# Patient Record
Sex: Female | Born: 1985 | Smoking: Never smoker
Health system: Southern US, Community
[De-identification: ages and names within clinical notes are randomized; demographics above are authoritative.]

## PROBLEM LIST (undated history)

## (undated) DIAGNOSIS — Z789 Other specified health status: Secondary | ICD-10-CM

## (undated) HISTORY — PX: CARPAL TUNNEL RELEASE: SHX101

---

## 2007-01-14 ENCOUNTER — Ambulatory Visit: Payer: Self-pay | Admitting: Obstetrics and Gynecology

## 2008-04-04 IMAGING — US ULTRASOUND RIGHT BREAST
1 series · 17 of 25 positions shown · non-contrast
Comparison: none

REASON FOR EXAM: Breast tenderness and discharge, nipple inverted
COMMENTS:

[Series 1: ultrasound right breast · 17 of 28 slices shown]
[im 1/28]
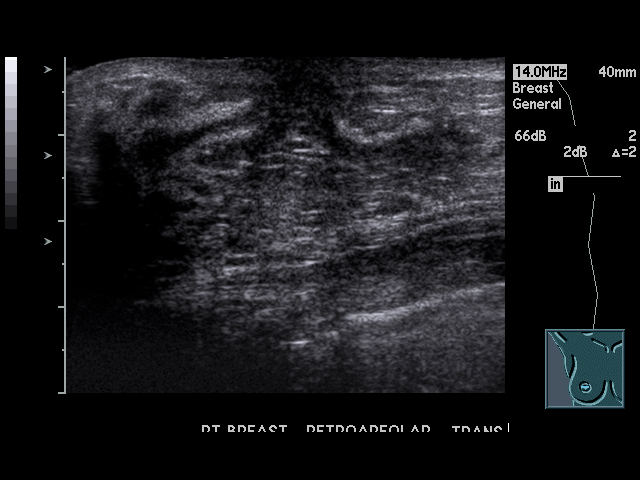
[im 3/28]
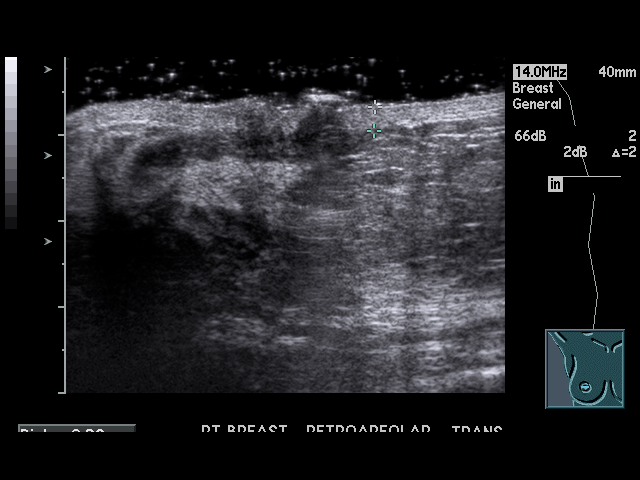
[im 4/28]
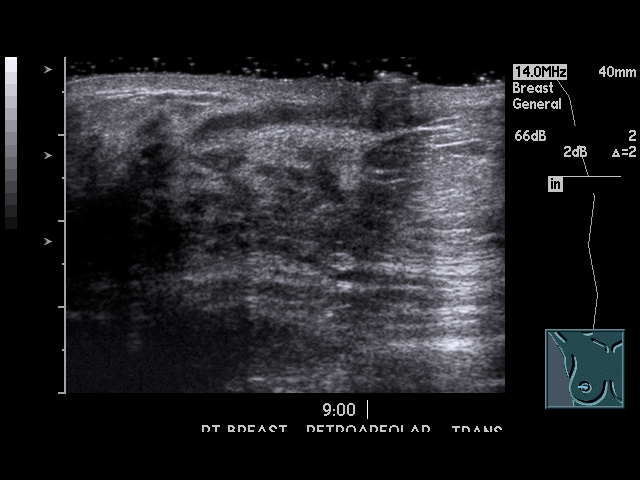
[im 6/28]
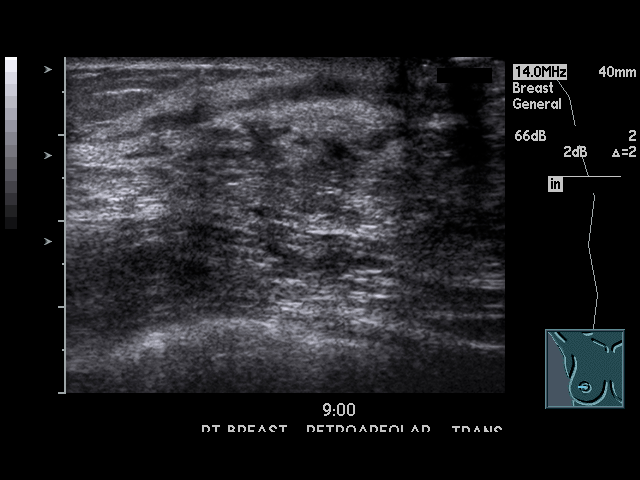
[im 7/28]
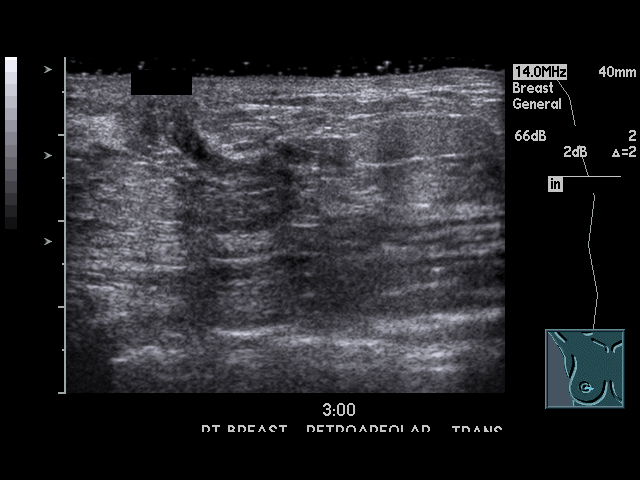
[im 10/28]
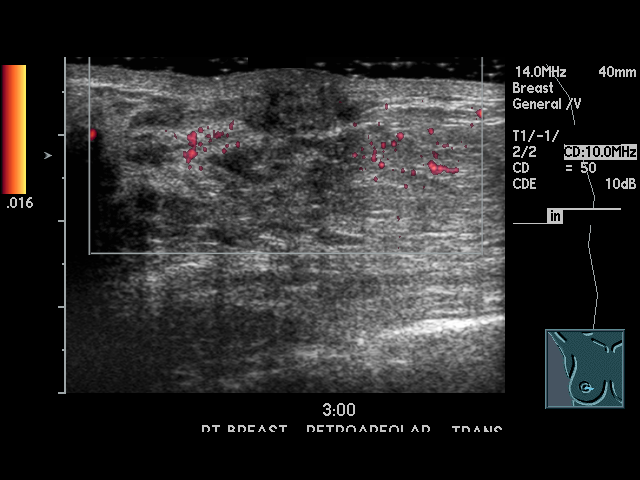
[im 11/28]
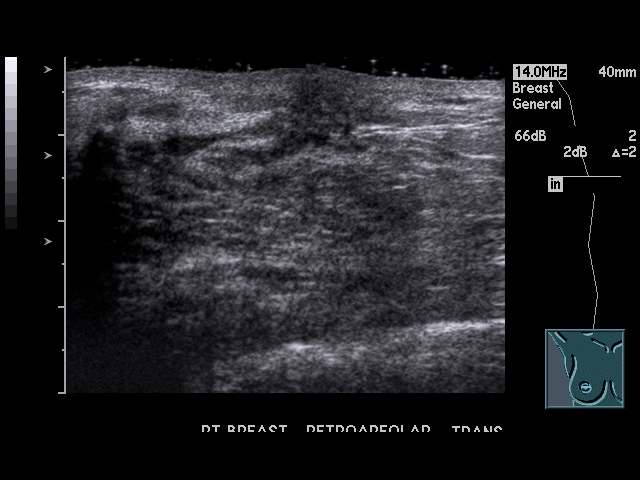
[im 13/28]
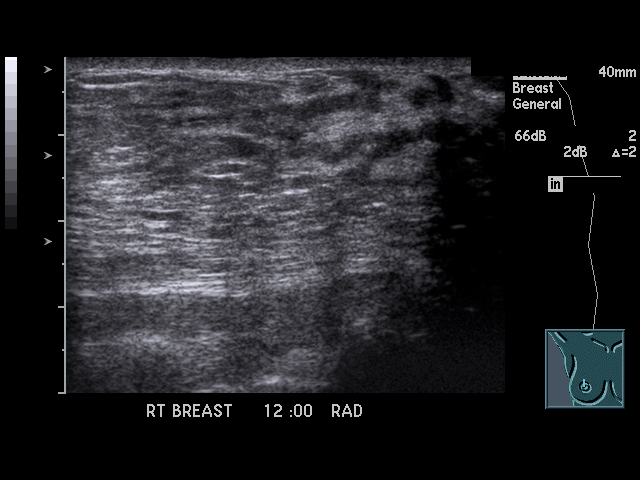
[im 14/28]
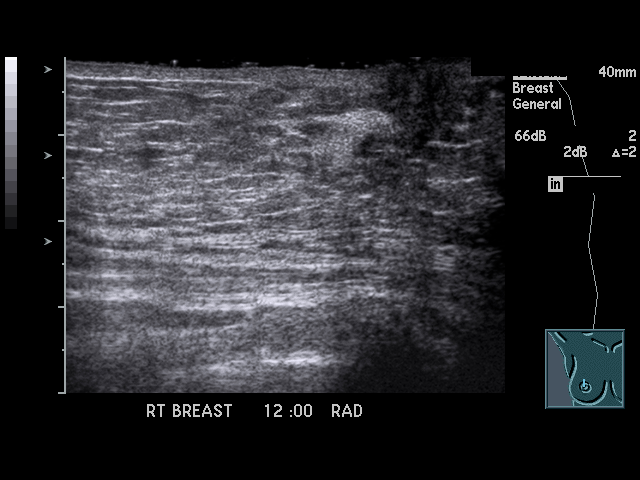
[im 15/28]
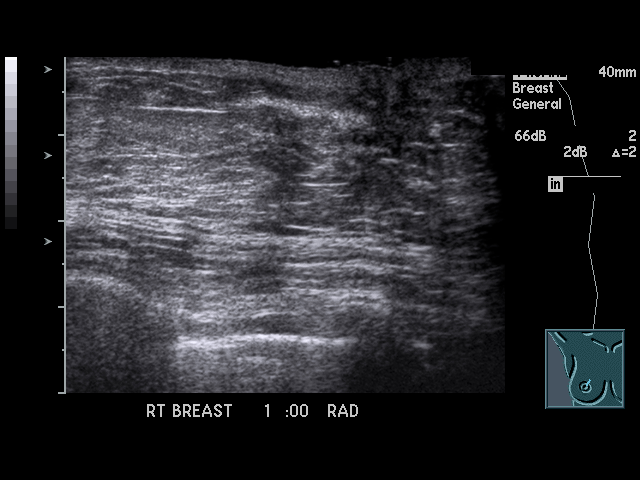
[im 17/28]
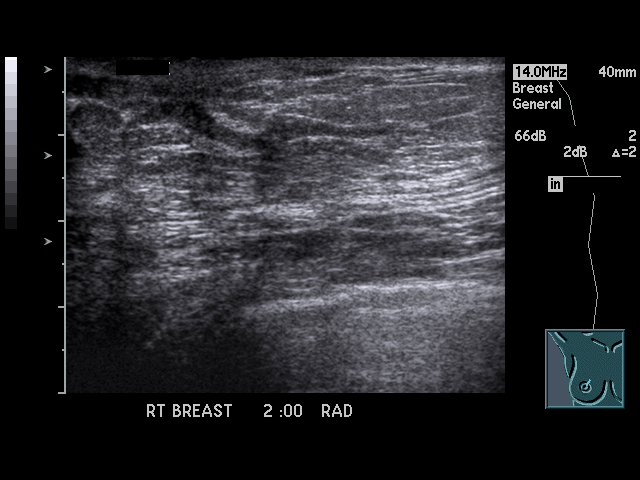
[im 19/28]
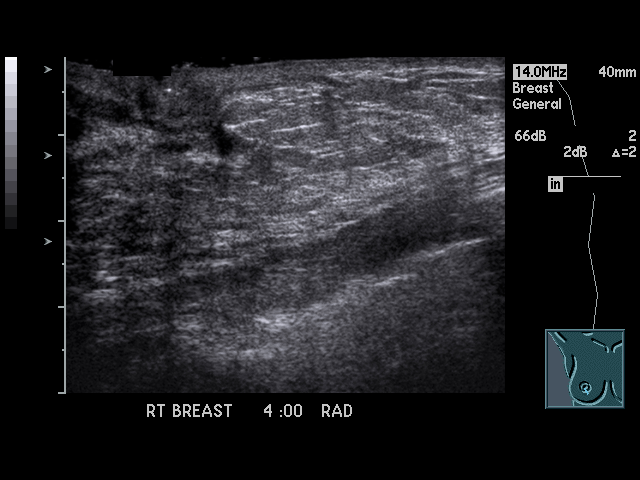
[im 21/28]
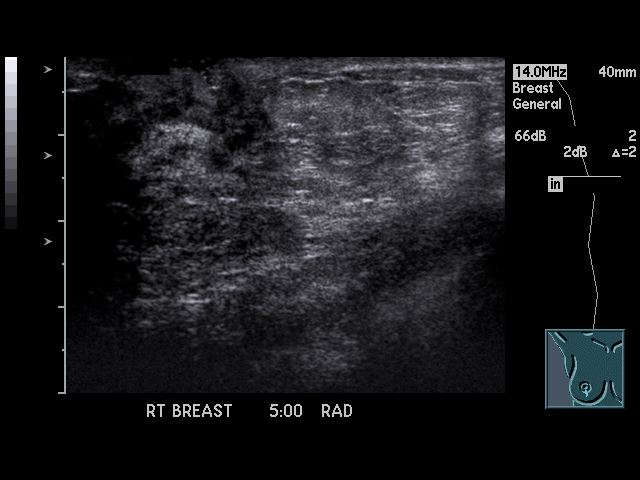
[im 22/28]
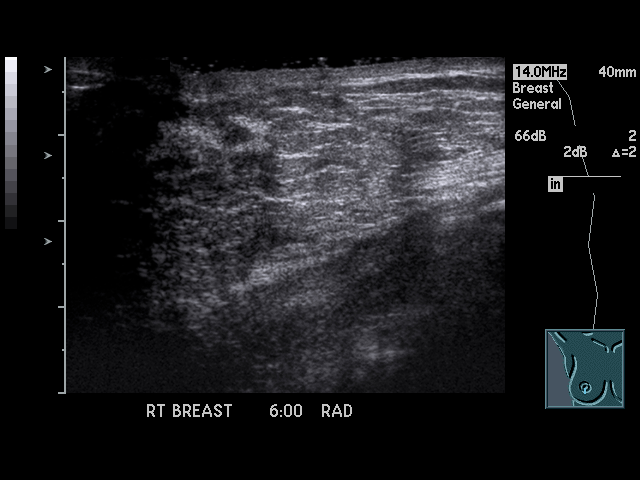
[im 24/28]
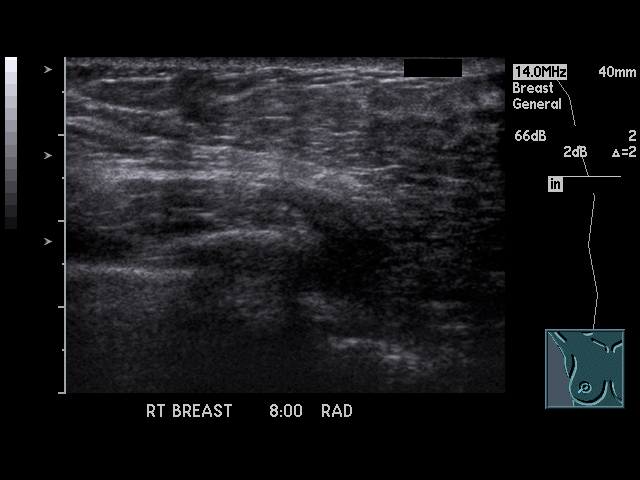
[im 25/28]
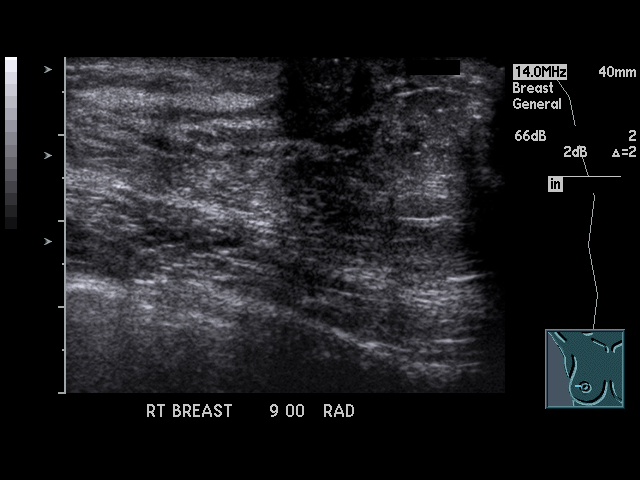
[im 28/28]
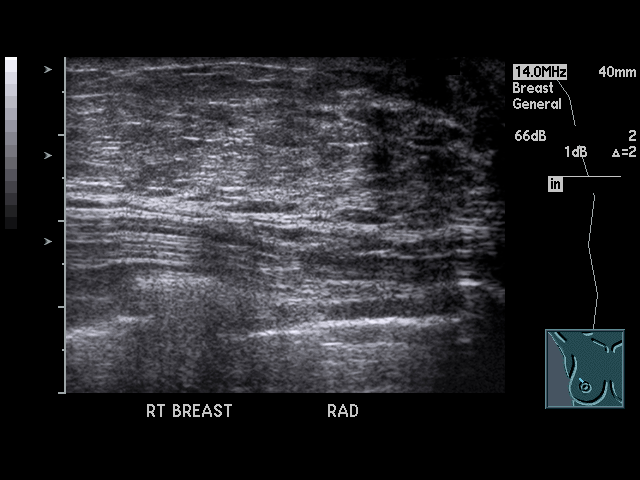

[17 of 25 positions shown; findings below may reference images not displayed]

PROCEDURE:     US  - US BREAST RIGHT  - January 14, 2007  [DATE]

RESULT:     The patient has a history of recent nipple inversion and nipple
discharge.

Targeted periareolar ultrasound was performed and shows a few, mildly
dilated ducts consistent with ductal ectasia. No mass lesions within the
ducts are seen to indicate papilloma formation. No retroareolar mass lesions
are seen. There is no distortion of the breast architecture.
IMPRESSION: 1.     Normal study except for mild ductal ectasia.
2.     No evidence for malignancy is seen.
3.     BI-RADS: Category 2 - Benign Finding.

## 2012-08-18 ENCOUNTER — Other Ambulatory Visit (HOSPITAL_COMMUNITY)
Admission: RE | Admit: 2012-08-18 | Discharge: 2012-08-18 | Disposition: A | Discharge status: Home / Self Care | Payer: 59 | Source: Ambulatory Visit | Attending: Family Medicine | Admitting: Family Medicine

## 2012-08-18 DIAGNOSIS — Z01419 Encounter for gynecological examination (general) (routine) without abnormal findings: Secondary | ICD-10-CM | POA: Insufficient documentation

## 2020-09-21 DIAGNOSIS — Z20822 Contact with and (suspected) exposure to covid-19: Secondary | ICD-10-CM | POA: Diagnosis not present

## 2020-09-26 DIAGNOSIS — Z20822 Contact with and (suspected) exposure to covid-19: Secondary | ICD-10-CM | POA: Diagnosis not present

## 2020-11-06 DIAGNOSIS — R200 Anesthesia of skin: Secondary | ICD-10-CM | POA: Diagnosis not present

## 2021-03-07 DIAGNOSIS — G5601 Carpal tunnel syndrome, right upper limb: Secondary | ICD-10-CM | POA: Diagnosis not present

## 2021-03-12 DIAGNOSIS — M545 Low back pain, unspecified: Secondary | ICD-10-CM | POA: Diagnosis not present

## 2021-03-12 DIAGNOSIS — G5603 Carpal tunnel syndrome, bilateral upper limbs: Secondary | ICD-10-CM | POA: Diagnosis not present

## 2021-03-25 DIAGNOSIS — M545 Low back pain, unspecified: Secondary | ICD-10-CM | POA: Diagnosis not present

## 2021-07-31 LAB — OB RESULTS CONSOLE RPR: RPR: NONREACTIVE

## 2021-07-31 LAB — HEPATITIS C ANTIBODY: HCV Ab: NEGATIVE

## 2021-07-31 LAB — OB RESULTS CONSOLE ABO/RH: RH Type: POSITIVE

## 2021-07-31 LAB — OB RESULTS CONSOLE GC/CHLAMYDIA
Chlamydia: NEGATIVE
Neisseria Gonorrhea: NEGATIVE

## 2021-07-31 LAB — OB RESULTS CONSOLE HEPATITIS B SURFACE ANTIGEN: Hepatitis B Surface Ag: NEGATIVE

## 2021-07-31 LAB — OB RESULTS CONSOLE RUBELLA ANTIBODY, IGM: Rubella: IMMUNE

## 2021-07-31 LAB — OB RESULTS CONSOLE ANTIBODY SCREEN: Antibody Screen: NEGATIVE

## 2021-07-31 LAB — OB RESULTS CONSOLE HIV ANTIBODY (ROUTINE TESTING): HIV: NONREACTIVE

## 2021-12-17 LAB — OB RESULTS CONSOLE HIV ANTIBODY (ROUTINE TESTING): HIV: NONREACTIVE

## 2022-02-11 LAB — OB RESULTS CONSOLE GBS: GBS: NEGATIVE

## 2022-02-23 ENCOUNTER — Inpatient Hospital Stay (HOSPITAL_BASED_OUTPATIENT_CLINIC_OR_DEPARTMENT_OTHER): Payer: 59

## 2022-02-23 ENCOUNTER — Inpatient Hospital Stay (HOSPITAL_COMMUNITY)
Admission: AD | Admit: 2022-02-23 | Discharge: 2022-02-23 | Disposition: A | Discharge status: Home / Self Care | Payer: 59 | Attending: Obstetrics and Gynecology | Admitting: Obstetrics and Gynecology

## 2022-02-23 ENCOUNTER — Encounter (HOSPITAL_COMMUNITY): Payer: Self-pay

## 2022-02-23 ENCOUNTER — Inpatient Hospital Stay (HOSPITAL_COMMUNITY): Payer: 59

## 2022-02-23 DIAGNOSIS — O09523 Supervision of elderly multigravida, third trimester: Secondary | ICD-10-CM | POA: Insufficient documentation

## 2022-02-23 DIAGNOSIS — J069 Acute upper respiratory infection, unspecified: Secondary | ICD-10-CM

## 2022-02-23 DIAGNOSIS — O26893 Other specified pregnancy related conditions, third trimester: Secondary | ICD-10-CM | POA: Insufficient documentation

## 2022-02-23 DIAGNOSIS — Z3A38 38 weeks gestation of pregnancy: Secondary | ICD-10-CM | POA: Insufficient documentation

## 2022-02-23 DIAGNOSIS — O99513 Diseases of the respiratory system complicating pregnancy, third trimester: Secondary | ICD-10-CM | POA: Insufficient documentation

## 2022-02-23 DIAGNOSIS — Z1152 Encounter for screening for COVID-19: Secondary | ICD-10-CM | POA: Diagnosis not present

## 2022-02-23 DIAGNOSIS — J329 Chronic sinusitis, unspecified: Secondary | ICD-10-CM | POA: Insufficient documentation

## 2022-02-23 DIAGNOSIS — R0602 Shortness of breath: Secondary | ICD-10-CM | POA: Diagnosis present

## 2022-02-23 DIAGNOSIS — O368390 Maternal care for abnormalities of the fetal heart rate or rhythm, unspecified trimester, not applicable or unspecified: Secondary | ICD-10-CM

## 2022-02-23 DIAGNOSIS — J0110 Acute frontal sinusitis, unspecified: Secondary | ICD-10-CM | POA: Diagnosis not present

## 2022-02-23 HISTORY — DX: Other specified health status: Z78.9

## 2022-02-23 LAB — CBC WITH DIFFERENTIAL/PLATELET
Abs Immature Granulocytes: 0.1 10*3/uL — ABNORMAL HIGH (ref 0.00–0.07)
Basophils Absolute: 0 10*3/uL (ref 0.0–0.1)
Basophils Relative: 0 %
Eosinophils Absolute: 0 10*3/uL (ref 0.0–0.5)
Eosinophils Relative: 0 %
HCT: 42.9 % (ref 36.0–46.0)
Hemoglobin: 14.3 g/dL (ref 12.0–15.0)
Immature Granulocytes: 1 %
Lymphocytes Relative: 9 %
Lymphs Abs: 1.4 10*3/uL (ref 0.7–4.0)
MCH: 30.4 pg (ref 26.0–34.0)
MCHC: 33.3 g/dL (ref 30.0–36.0)
MCV: 91.3 fL (ref 80.0–100.0)
Monocytes Absolute: 1 10*3/uL (ref 0.1–1.0)
Monocytes Relative: 6 %
Neutro Abs: 12.6 10*3/uL — ABNORMAL HIGH (ref 1.7–7.7)
Neutrophils Relative %: 84 %
Platelets: 197 10*3/uL (ref 150–400)
RBC: 4.7 MIL/uL (ref 3.87–5.11)
RDW: 13.2 % (ref 11.5–15.5)
WBC: 15.1 10*3/uL — ABNORMAL HIGH (ref 4.0–10.5)
nRBC: 0 % (ref 0.0–0.2)

## 2022-02-23 LAB — RESP PANEL BY RT-PCR (RSV, FLU A&B, COVID)  RVPGX2
Influenza A by PCR: NEGATIVE
Influenza B by PCR: NEGATIVE
Resp Syncytial Virus by PCR: NEGATIVE
SARS Coronavirus 2 by RT PCR: NEGATIVE

## 2022-02-23 LAB — BASIC METABOLIC PANEL
Anion gap: 12 (ref 5–15)
BUN: 10 mg/dL (ref 6–20)
CO2: 19 mmol/L — ABNORMAL LOW (ref 22–32)
Calcium: 8.9 mg/dL (ref 8.9–10.3)
Chloride: 102 mmol/L (ref 98–111)
Creatinine, Ser: 0.68 mg/dL (ref 0.44–1.00)
GFR, Estimated: >60 mL/min (ref >60)
Glucose, Bld: 94 mg/dL (ref 70–99)
Potassium: 4.1 mmol/L (ref 3.5–5.1)
Sodium: 133 mmol/L — ABNORMAL LOW (ref 135–145)

## 2022-02-23 LAB — URINALYSIS, ROUTINE W REFLEX MICROSCOPIC
Bilirubin Urine: NEGATIVE
Glucose, UA: NEGATIVE mg/dL
Hgb urine dipstick: NEGATIVE
Ketones, ur: 20 mg/dL — AB
Leukocytes,Ua: NEGATIVE
Nitrite: NEGATIVE
Protein, ur: NEGATIVE mg/dL
Specific Gravity, Urine: 1.017 (ref 1.005–1.030)
pH: 7 (ref 5.0–8.0)

## 2022-02-23 LAB — AMNISURE RUPTURE OF MEMBRANE (ROM) NOT AT ARMC: Amnisure ROM: NEGATIVE

## 2022-02-23 LAB — LACTIC ACID, PLASMA: Lactic Acid, Venous: 1 mmol/L (ref 0.5–1.9)

## 2022-02-23 MED ORDER — LACTATED RINGERS IV BOLUS
1000.0000 mL | Freq: Once | INTRAVENOUS | Status: AC
  Administered 2022-02-23: 1000 mL via INTRAVENOUS

## 2022-02-23 MED ORDER — AMOXICILLIN-POT CLAVULANATE 875-125 MG PO TABS: 1.0000 | ORAL_TABLET | Freq: Two times a day (BID) | ORAL | 0 refills | Status: DC

## 2022-02-23 MED ORDER — GUAIFENESIN ER 600 MG PO TB12
600.0000 mg | ORAL_TABLET | Freq: Once | ORAL | Status: AC
  Administered 2022-02-23: 600 mg via ORAL
  Filled 2022-02-23: qty 1

## 2022-02-23 MED ORDER — LACTATED RINGERS IV BOLUS
500.0000 mL | Freq: Once | INTRAVENOUS | Status: AC
  Administered 2022-02-23: 500 mL via INTRAVENOUS

## 2022-02-23 MED ORDER — ACETAMINOPHEN 500 MG PO TABS
1000.0000 mg | ORAL_TABLET | Freq: Once | ORAL | Status: AC
  Administered 2022-02-23: 1000 mg via ORAL
  Filled 2022-02-23: qty 2

## 2022-02-23 MED ORDER — ALBUTEROL SULFATE HFA 108 (90 BASE) MCG/ACT IN AERS
2.0000 | INHALATION_SPRAY | RESPIRATORY_TRACT | Status: DC | PRN
  Administered 2022-02-23: 2 via RESPIRATORY_TRACT
  Filled 2022-02-23: qty 6.7

## 2022-02-23 MED ORDER — ALBUTEROL SULFATE HFA 108 (90 BASE) MCG/ACT IN AERS: 2.0000 | INHALATION_SPRAY | RESPIRATORY_TRACT | 0 refills | Status: DC | PRN

## 2022-02-23 NOTE — Discharge Instructions (Signed)

## 2022-02-23 NOTE — MAU Note (Signed)
Patient reports that she's had a gush of fluid in the last few minutes, unsure if it was urine or her water when she was coughing, CNM notified.

## 2022-02-23 NOTE — MAU Note (Signed)
.  Kristin Richard is a 36 y.o. at Unknown here in MAU reporting: ongoing/worsening cold symptoms.  Pt reports today her cough is worse and that she has had a fever of 101.  Reports she last took tylenol at 0800 this morning.  Reports SOB that worsens with activity and pain in her chest when trying to take deep breaths.  Denies LOF, reg ctx or LOF.  +FM   Onset of complaint: 2 weeks ago Pain score:  There were no vitals filed for this visit.   FHT:195 Lab orders placed from triage:

## 2022-02-23 NOTE — MAU Provider Note (Signed)
History     CSN: [id number]  Arrival date and time: 02/23/22 1334   Event Date/Time   First Provider Initiated Contact with Patient 02/23/22 1351      Chief Complaint  Patient presents with   Shortness of Breath   Cough   Chest Pain   HPI Kristin Richard is a 36 y.o. G3P2002 38.0 weeks who presents to MAU with new complaint of shortness of breath and activity intolerance. Patient states and  her entire family have been sick for about two weeks. She woke up this morning with SOB, chest tightness and activity intolerance that were significantly worse than they had ever been. She took Tylenol at 0800 but did not experience relief.  Patient receives care with [facility]  OB History     Gravida  1   Para      Term      Preterm      AB      Living         SAB      IAB      Ectopic      Multiple      Live Births              Past Medical History:  Diagnosis Date   Medical history non-contributory     Past Surgical History:  Procedure Laterality Date   CARPAL TUNNEL RELEASE      History reviewed. No pertinent family history.     Allergies: Not on File  No medications prior to admission.    Review of Systems  Constitutional:  Positive for fatigue and fever.  Respiratory:  Positive for chest tightness and shortness of breath.   Musculoskeletal:  Positive for arthralgias.  All other systems reviewed and are negative.  Physical Exam   Blood pressure 137/72, pulse (!) 118, temperature 99.6 F (37.6 C), temperature source Oral, resp. rate (!) 24, weight 105.1 kg, SpO2 98 %.  Physical Exam Vitals and nursing note reviewed.  Constitutional:      Appearance: She is well-developed. She is obese. She is ill-appearing.  Cardiovascular:     Rate and Rhythm: Normal rate and regular rhythm.  Pulmonary:     Effort: Pulmonary effort is normal.     Comments: Patient vocalizing in an effort to avoid vocalizing when CNM initially attempted to auscultate  lung sounds. CNM returned to bedside after Albuterol, lungs CTAB Skin:    Capillary Refill: Capillary refill takes less than 2 seconds.  Neurological:     Mental Status: She is alert and oriented to person, place, and time.  Psychiatric:        Mood and Affect: Mood normal.        Behavior: Behavior normal.     MAU Course  Procedures  MDM --Reactive tracing s/p fluid bolus --Fetal tachycardia throughout encounter, baseline improved to 165 prior to BPP, mod var, + accels, no decels --Toco: UI --Evaluation discussed with Dr. Donavan Foil, who agrees patient may merit observation on OBSC due to persistent fetal tachycardia. Report called to Dr. Reina Fuse at 1600. Verbal order placed for BPP --Dr. Reina Fuse in unit around 1700 hours (walked patient back to room following BPP). Care assumed of patient. Per Dr. Reina Fuse patient to receive additional 500 mL bolus, d/c home with treatment for Sinusitis.   Orders Placed This Encounter  Procedures   Resp panel by RT-PCR (RSV, Flu A&B, Covid) Anterior Nasal Swab   DG Chest Port 1 View   [place] MFM Fetal  BPP Wo Non Stress   Urinalysis, Routine w reflex microscopic Urine, Clean Catch   CBC with Differential/Platelet   Basic metabolic panel   Lactic acid, plasma   Amnisure rupture of membrane (rom)not at [place] and Contact precautions   Insert peripheral IV   Discharge patient    Patient Vitals for the past 24 hrs:  BP Temp Temp src Pulse Resp SpO2 Weight  02/23/22 1728 -- -- -- (!) 118 (!) 21 98 % --  02/23/22 1726 126/63 -- -- (!) 123 -- -- --  02/23/22 1555 -- 98.4 F (36.9 C) Oral -- -- -- --  02/23/22 1513 -- 99.6 F (37.6 C) Oral -- -- -- --  02/23/22 1411 -- (!) 102.7 F (39.3 C) Oral -- -- -- --  02/23/22 1358 137/72 99.6 F (37.6 C) Oral (!) 118 (!) 24 98 % 105.1 kg   Results for orders placed or performed during the hospital encounter of 02/23/22 (from the past 24 hour(s))  Resp panel by RT-PCR (RSV, Flu A&B, Covid) Anterior  Nasal Swab     Status: None   Collection Time: 02/23/22  1:45 PM   Specimen: Anterior Nasal Swab  Result Value Ref Range   SARS Coronavirus 2 by RT PCR NEGATIVE NEGATIVE   Influenza A by PCR NEGATIVE NEGATIVE   Influenza B by PCR NEGATIVE NEGATIVE   Resp Syncytial Virus by PCR NEGATIVE NEGATIVE  Urinalysis, Routine w reflex microscopic Urine, Clean Catch     Status: Abnormal   Collection Time: 02/23/22  1:49 PM  Result Value Ref Range   Color, Urine YELLOW YELLOW   APPearance HAZY (A) CLEAR   Specific Gravity, Urine 1.017 1.005 - 1.030   pH 7.0 5.0 - 8.0   Glucose, UA NEGATIVE NEGATIVE mg/dL   Hgb urine dipstick NEGATIVE NEGATIVE   Bilirubin Urine NEGATIVE NEGATIVE   Ketones, ur 20 (A) NEGATIVE mg/dL   Protein, ur NEGATIVE NEGATIVE mg/dL   Nitrite NEGATIVE NEGATIVE   Leukocytes,Ua NEGATIVE NEGATIVE  CBC with Differential/Platelet     Status: Abnormal   Collection Time: 02/23/22  2:14 PM  Result Value Ref Range   WBC 15.1 (H) 4.0 - 10.5 K/uL   RBC 4.70 3.87 - 5.11 MIL/uL   Hemoglobin 14.3 12.0 - 15.0 g/dL   HCT 42.9 36.0 - 46.0 %   MCV 91.3 80.0 - 100.0 fL   MCH 30.4 26.0 - 34.0 pg   MCHC 33.3 30.0 - 36.0 g/dL   RDW 13.2 11.5 - 15.5 %   Platelets 197 150 - 400 K/uL   nRBC 0.0 0.0 - 0.2 %   Neutrophils Relative % 84 %   Neutro Abs 12.6 (H) 1.7 - 7.7 K/uL   Lymphocytes Relative 9 %   Lymphs Abs 1.4 0.7 - 4.0 K/uL   Monocytes Relative 6 %   Monocytes Absolute 1.0 0.1 - 1.0 K/uL   Eosinophils Relative 0 %   Eosinophils Absolute 0.0 0.0 - 0.5 K/uL   Basophils Relative 0 %   Basophils Absolute 0.0 0.0 - 0.1 K/uL   Immature Granulocytes 1 %   Abs Immature Granulocytes 0.10 (H) 0.00 - 0.07 K/uL  Basic metabolic panel     Status: Abnormal   Collection Time: 02/23/22  2:14 PM  Result Value Ref Range   Sodium 133 (L) 135 - 145 mmol/L   Potassium 4.1 3.5 - 5.1 mmol/L   Chloride 102 98 - 111 mmol/L   CO2 19 (L) 22 - 32  mmol/L   Glucose, Bld 94 70 - 99 mg/dL   BUN 10 6 - 20  mg/dL   Creatinine, Ser 0.68 0.44 - 1.00 mg/dL   Calcium 8.9 8.9 - 10.3 mg/dL   GFR, Estimated >60 >60 mL/min   Anion gap 12 5 - 15  Lactic acid, plasma     Status: None   Collection Time: 02/23/22  2:14 PM  Result Value Ref Range   Lactic Acid, Venous 1.0 0.5 - 1.9 mmol/L  Amnisure rupture of membrane (rom)not at [facility]     Status: None   Collection Time: 02/23/22  3:20 PM  Result Value Ref Range   Amnisure ROM NEGATIVE    DG Chest Port 1 View  Result Date: 02/23/2022 CLINICAL DATA:  Shortness of breath. EXAM: PORTABLE CHEST 1 VIEW COMPARISON:  None Available. FINDINGS: Cardiac silhouette and mediastinal contours are within normal limits. The lungs are clear. No pleural effusion or pneumothorax. Mild dextrocurvature of the midthoracic spine with mild multilevel degenerative disc changes. IMPRESSION: No active disease. Electronically Signed   By: Yvonne Kendall M.D.   On: 02/23/2022 16:15    Meds ordered this encounter  Medications   lactated ringers bolus 1,000 mL   albuterol (VENTOLIN HFA) 108 (90 Base) MCG/ACT inhaler 2 puff   acetaminophen (TYLENOL) tablet 1,000 mg   guaiFENesin (MUCINEX) 12 hr tablet 600 mg   lactated ringers bolus 500 mL   lactated ringers bolus 500 mL   albuterol (VENTOLIN HFA) 108 (90 Base) MCG/ACT inhaler    Sig: Inhale 2 puffs into the lungs every 4 (four) hours as needed for up to 7 days for wheezing or shortness of breath.    Dispense:  1 each    Refill:  0    Order Specific Question:   Supervising Provider    Answer:   Lynnda Shields A [id number]   amoxicillin-clavulanate (AUGMENTIN) 875-125 MG tablet    Sig: Take 1 tablet by mouth 2 (two) times daily for 7 days.    Dispense:  14 tablet    Refill:  0    Order Specific Question:   Supervising Provider    Answer:   Griffin Basil [id number]   Assessment and Plan  --36 y.o. G3P2002 at [redacted]w[redacted]d  --Treatment initiated for Sinusitis, possible viral process in work --Negative for Flu, COVID and  RSV --Normal chest xray --Care coordinated with Dr. Wilhelmenia Blase --Discharge home in stable condition with treatment for Sinusitis, strict return precautions  Darlina Rumpf, [place], MSN, CNM 02/23/2022, 8:16 PM

## 2022-02-24 ENCOUNTER — Telehealth: Payer: Self-pay | Admitting: Advanced Practice Midwife

## 2022-02-24 MED ORDER — ALBUTEROL SULFATE HFA 108 (90 BASE) MCG/ACT IN AERS: 2.0000 | INHALATION_SPRAY | RESPIRATORY_TRACT | 0 refills | Status: DC | PRN

## 2022-02-24 MED ORDER — AMOXICILLIN-POT CLAVULANATE 875-125 MG PO TABS: 1.0000 | ORAL_TABLET | Freq: Two times a day (BID) | ORAL | 0 refills | Status: AC

## 2022-02-24 NOTE — Telephone Encounter (Signed)
Messaged received from MAU RN that patient's pharmacy from MAU encounter yesterday is closed. Medications resent to new pharmacy after speaking with patient's listed alternate contact (FOB/partner) [place].  Mallie Snooks, [place], MSN, CNM Certified Nurse Midwife, Product/process development scientist for [employer], [place]

## 2022-02-24 NOTE — L&D Delivery Note (Signed)
Delivery Note   Pt progressed to complete dilation and pushed twice for delivery. At 7:07 PM a healthy female was delivered via Vaginal, Spontaneous (Presentation: Occiput Anterior).  APGAR: 9, 9; weight  pending.   Placenta status: Spontaneous, Intact.  Cord: 3 vessels with the following complications: None.   Anesthesia: Epidural Episiotomy: None Lacerations: Periurethral abrasion, repaired for hemostasis Suture Repair: 3.0 vicryl rapide Est. Blood Loss (mL): 31mL  Mom to postpartum.  Baby to Couplet care / Skin to Skin.  D/w parents circumcision and they desire.  Logan Bores 03/07/2022, 7:37 PM

## 2022-03-06 ENCOUNTER — Telehealth (HOSPITAL_COMMUNITY): Payer: Self-pay | Admitting: *Deleted

## 2022-03-06 NOTE — Telephone Encounter (Signed)
Preadmission screen  

## 2022-03-07 ENCOUNTER — Telehealth (HOSPITAL_COMMUNITY): Payer: Self-pay | Admitting: *Deleted

## 2022-03-07 ENCOUNTER — Inpatient Hospital Stay (HOSPITAL_COMMUNITY): Payer: 59 | Admitting: Anesthesiology

## 2022-03-07 ENCOUNTER — Inpatient Hospital Stay (HOSPITAL_COMMUNITY)
Admission: AD | Admit: 2022-03-07 | Discharge: 2022-03-09 | DRG: 807 | Disposition: A | Discharge status: Home / Self Care | Payer: 59 | Attending: Obstetrics and Gynecology | Admitting: Obstetrics and Gynecology

## 2022-03-07 ENCOUNTER — Encounter (HOSPITAL_COMMUNITY): Payer: Self-pay | Admitting: Obstetrics and Gynecology

## 2022-03-07 ENCOUNTER — Other Ambulatory Visit: Payer: Self-pay

## 2022-03-07 ENCOUNTER — Encounter (HOSPITAL_COMMUNITY): Payer: Self-pay | Admitting: *Deleted

## 2022-03-07 DIAGNOSIS — O26893 Other specified pregnancy related conditions, third trimester: Secondary | ICD-10-CM | POA: Diagnosis present

## 2022-03-07 DIAGNOSIS — O99214 Obesity complicating childbirth: Principal | ICD-10-CM | POA: Diagnosis present

## 2022-03-07 DIAGNOSIS — Z3A39 39 weeks gestation of pregnancy: Secondary | ICD-10-CM | POA: Diagnosis not present

## 2022-03-07 LAB — CBC
HCT: 41.8 % (ref 36.0–46.0)
Hemoglobin: 14.4 g/dL (ref 12.0–15.0)
MCH: 30.4 pg (ref 26.0–34.0)
MCHC: 34.4 g/dL (ref 30.0–36.0)
MCV: 88.4 fL (ref 80.0–100.0)
Platelets: 222 10*3/uL (ref 150–400)
RBC: 4.73 MIL/uL (ref 3.87–5.11)
RDW: 12.8 % (ref 11.5–15.5)
WBC: 12.3 10*3/uL — ABNORMAL HIGH (ref 4.0–10.5)
nRBC: 0 % (ref 0.0–0.2)

## 2022-03-07 LAB — TYPE AND SCREEN
ABO/RH(D): O POS
Antibody Screen: NEGATIVE

## 2022-03-07 MED ORDER — LACTATED RINGERS IV SOLN: 500.0000 mL | INTRAVENOUS | Status: DC | PRN

## 2022-03-07 MED ORDER — FENTANYL-BUPIVACAINE-NACL 0.5-0.125-0.9 MG/250ML-% EP SOLN
12.0000 mL/h | EPIDURAL | Status: DC | PRN
  Administered 2022-03-07: 12 mL/h via EPIDURAL
  Filled 2022-03-07: qty 250

## 2022-03-07 MED ORDER — FENTANYL CITRATE (PF) 100 MCG/2ML IJ SOLN: 50.0000 ug | INTRAMUSCULAR | Status: DC | PRN

## 2022-03-07 MED ORDER — PRENATAL MULTIVITAMIN CH
1.0000 | ORAL_TABLET | Freq: Every day | ORAL | Status: DC
  Administered 2022-03-08: 1 via ORAL
  Filled 2022-03-07: qty 1

## 2022-03-07 MED ORDER — BENZOCAINE-MENTHOL 20-0.5 % EX AERO
1.0000 | INHALATION_SPRAY | CUTANEOUS | Status: DC | PRN
  Administered 2022-03-08: 1 via TOPICAL
  Filled 2022-03-07: qty 56

## 2022-03-07 MED ORDER — PHENYLEPHRINE 80 MCG/ML (10ML) SYRINGE FOR IV PUSH (FOR BLOOD PRESSURE SUPPORT): 80.0000 ug | PREFILLED_SYRINGE | INTRAVENOUS | Status: DC | PRN

## 2022-03-07 MED ORDER — DIPHENHYDRAMINE HCL 50 MG/ML IJ SOLN: 12.5000 mg | INTRAMUSCULAR | Status: DC | PRN

## 2022-03-07 MED ORDER — LACTATED RINGERS IV SOLN: 500.0000 mL | Freq: Once | INTRAVENOUS | Status: DC

## 2022-03-07 MED ORDER — ALBUTEROL SULFATE (2.5 MG/3ML) 0.083% IN NEBU: 2.5000 mg | INHALATION_SOLUTION | RESPIRATORY_TRACT | Status: DC | PRN

## 2022-03-07 MED ORDER — ACETAMINOPHEN 325 MG PO TABS
650.0000 mg | ORAL_TABLET | ORAL | Status: DC | PRN
  Administered 2022-03-07: 650 mg via ORAL
  Filled 2022-03-07: qty 2

## 2022-03-07 MED ORDER — SOD CITRATE-CITRIC ACID 500-334 MG/5ML PO SOLN: 30.0000 mL | ORAL | Status: DC | PRN

## 2022-03-07 MED ORDER — TETANUS-DIPHTH-ACELL PERTUSSIS 5-2.5-18.5 LF-MCG/0.5 IM SUSY: 0.5000 mL | PREFILLED_SYRINGE | Freq: Once | INTRAMUSCULAR | Status: DC

## 2022-03-07 MED ORDER — EPHEDRINE 5 MG/ML INJ: 10.0000 mg | INTRAVENOUS | Status: DC | PRN

## 2022-03-07 MED ORDER — ZOLPIDEM TARTRATE 5 MG PO TABS
5.0000 mg | ORAL_TABLET | Freq: Every evening | ORAL | Status: DC | PRN
  Administered 2022-03-08: 5 mg via ORAL
  Filled 2022-03-07: qty 1

## 2022-03-07 MED ORDER — OXYCODONE-ACETAMINOPHEN 5-325 MG PO TABS: 1.0000 | ORAL_TABLET | ORAL | Status: DC | PRN

## 2022-03-07 MED ORDER — SIMETHICONE 80 MG PO CHEW: 80.0000 mg | CHEWABLE_TABLET | ORAL | Status: DC | PRN

## 2022-03-07 MED ORDER — ONDANSETRON HCL 4 MG PO TABS: 4.0000 mg | ORAL_TABLET | ORAL | Status: DC | PRN

## 2022-03-07 MED ORDER — OXYTOCIN BOLUS FROM INFUSION
333.0000 mL | Freq: Once | INTRAVENOUS | Status: AC
  Administered 2022-03-07: 333 mL via INTRAVENOUS

## 2022-03-07 MED ORDER — ONDANSETRON HCL 4 MG/2ML IJ SOLN
4.0000 mg | Freq: Four times a day (QID) | INTRAMUSCULAR | Status: DC | PRN
  Administered 2022-03-07: 4 mg via INTRAVENOUS
  Filled 2022-03-07: qty 2

## 2022-03-07 MED ORDER — IBUPROFEN 600 MG PO TABS
600.0000 mg | ORAL_TABLET | Freq: Four times a day (QID) | ORAL | Status: DC
  Administered 2022-03-07 – 2022-03-09 (×6): 600 mg via ORAL
  Filled 2022-03-07 (×6): qty 1

## 2022-03-07 MED ORDER — ONDANSETRON HCL 4 MG/2ML IJ SOLN: 4.0000 mg | INTRAMUSCULAR | Status: DC | PRN

## 2022-03-07 MED ORDER — DIPHENHYDRAMINE HCL 25 MG PO CAPS: 25.0000 mg | ORAL_CAPSULE | Freq: Four times a day (QID) | ORAL | Status: DC | PRN

## 2022-03-07 MED ORDER — BUPIVACAINE HCL (PF) 0.25 % IJ SOLN
INTRAMUSCULAR | Status: DC | PRN
  Administered 2022-03-07: 1.6 mL via INTRATHECAL

## 2022-03-07 MED ORDER — DIBUCAINE (PERIANAL) 1 % EX OINT: 1.0000 | TOPICAL_OINTMENT | CUTANEOUS | Status: DC | PRN

## 2022-03-07 MED ORDER — LACTATED RINGERS IV SOLN: INTRAVENOUS | Status: DC

## 2022-03-07 MED ORDER — ACETAMINOPHEN 325 MG PO TABS: 650.0000 mg | ORAL_TABLET | ORAL | Status: DC | PRN

## 2022-03-07 MED ORDER — SENNOSIDES-DOCUSATE SODIUM 8.6-50 MG PO TABS
2.0000 | ORAL_TABLET | ORAL | Status: DC
  Administered 2022-03-08: 2 via ORAL
  Filled 2022-03-07: qty 2

## 2022-03-07 MED ORDER — OXYTOCIN-SODIUM CHLORIDE 30-0.9 UT/500ML-% IV SOLN
2.5000 [IU]/h | INTRAVENOUS | Status: DC
  Administered 2022-03-07: 2.5 [IU]/h via INTRAVENOUS
  Filled 2022-03-07: qty 500

## 2022-03-07 MED ORDER — LIDOCAINE HCL (PF) 1 % IJ SOLN: 30.0000 mL | INTRAMUSCULAR | Status: DC | PRN

## 2022-03-07 MED ORDER — COCONUT OIL OIL
1.0000 | TOPICAL_OIL | Status: DC | PRN
  Administered 2022-03-08 (×2): 1 via TOPICAL

## 2022-03-07 MED ORDER — OXYCODONE-ACETAMINOPHEN 5-325 MG PO TABS: 2.0000 | ORAL_TABLET | ORAL | Status: DC | PRN

## 2022-03-07 MED ORDER — WITCH HAZEL-GLYCERIN EX PADS: 1.0000 | MEDICATED_PAD | CUTANEOUS | Status: DC | PRN

## 2022-03-07 NOTE — Telephone Encounter (Signed)
Preadmission screen  

## 2022-03-07 NOTE — Progress Notes (Signed)
Patient ID: Kristin Richard, female   DOB: 02-26-1985, 37 y.o.   MRN: [id number] Comfortable with epidural  Afeb VSS FHR category 1  90/7/-1  Husband stepped out to get dinner Will AROM after his return

## 2022-03-07 NOTE — Plan of Care (Signed)

## 2022-03-07 NOTE — H&P (Signed)
Kristin Richard is a 37 y.o. female G3P2002 at 74 5/7 weeks (EDD 03/09/22 by 8 week [place] inconsistent with LMP) presenting with painful contraction and cervical change to  5cm in MAU.    Prenatal care significant for: 1) AMA-low risk NIPT 2) Obesity-BMI>30 3) h/o mild shoulder dystocia with G2-no issues postpartum  OB History     Gravida  3   Para  2   Term  2   Preterm      AB      Living  2      SAB      IAB      Ectopic      Multiple      Live Births  2         05-08-2016, 41 wks  F, 7lbs 11oz, Vaginal Delivery  06-21-2020, 41 wks M, 8lbs 11oz, Vaginal Delivery  Past Medical History:  Diagnosis Date   Medical history non-contributory    Past Surgical History:  Procedure Laterality Date   CARPAL TUNNEL RELEASE     Family History: family history includes Diabetes in her mother; Hypertension in her father. Social History:  reports that she has never smoked. She has never used smokeless tobacco. She reports that she does not drink alcohol and does not use drugs.     Maternal Diabetes: No Genetic Screening: Normal Maternal Ultrasounds/Referrals: Normal Fetal Ultrasounds or other Referrals:  None Maternal Substance Abuse:  No Significant Maternal Medications:  None Significant Maternal Lab Results:  Group B Strep negative Number of Prenatal Visits:greater than 3 verified prenatal visits Other Comments:  None  Review of Systems  Constitutional:  Negative for fever.  Gastrointestinal:  Positive for abdominal pain.  Genitourinary:  Negative for vaginal bleeding.   Maternal Medical History:  Reason for admission: Contractions.     Dilation: 5 Effacement (%): 80 Station: -2 Exam by:: Marine scientist, RN Blood pressure (!) 140/81, pulse 79, temperature 98.4 F (36.9 C), temperature source Oral, resp. rate 20, height 5\' 5"  (1.651 m), weight 102.5 kg. Maternal Exam:  Uterine Assessment: Contraction strength is firm.  Contraction frequency is regular.   Abdomen: Fetal presentation: vertex Introitus: Normal vulva. Normal vagina.  Pelvis: adequate for delivery.     Physical Exam Cardiovascular:     Rate and Rhythm: Normal rate and regular rhythm.  Pulmonary:     Effort: Pulmonary effort is normal.  Abdominal:     Palpations: Abdomen is soft.  Genitourinary:    General: Normal vulva.  Neurological:     General: No focal deficit present.     Mental Status: She is alert.  Psychiatric:        Mood and Affect: Mood normal.     Prenatal labs: ABO, Rh: O positive Antibody: Negative Rubella: Immune (06/07 0000) RPR: Nonreactive (06/07 0000)  HBsAg: Negative (06/07 0000)  HIV: Non-reactive (10/24 0000)  GBS: Negative/-- (12/19 0000)  One hour GCT 133 Hgb AA  Assessment/Plan: Pt admitted in active labor and would like epidural.  Will get epidural and possible AROM thereafter.    Logan Bores 03/07/2022, 4:26 PM

## 2022-03-07 NOTE — Anesthesia Procedure Notes (Signed)
Epidural Patient location during procedure: OB Start time: 03/07/2022 4:45 PM End time: 03/07/2022 4:55 PM  Staffing Anesthesiologist: Freddrick March, MD Performed: anesthesiologist   Preanesthetic Checklist Completed: patient identified, IV checked, risks and benefits discussed, monitors and equipment checked, pre-op evaluation and timeout performed  Epidural Patient position: sitting Prep: DuraPrep and site prepped and draped Patient monitoring: continuous pulse ox, blood pressure, heart rate and cardiac monitor Approach: midline Location: L3-L4 Injection technique: LOR air  Needle:  Needle type: Tuohy  Needle gauge: 17 G Needle length: 9 cm Needle insertion depth: 7 cm Catheter type: closed end flexible Catheter size: 19 Gauge Catheter at skin depth: 12 cm Test dose: negative  Assessment Sensory level: T8 Events: blood not aspirated, no cerebrospinal fluid, injection not painful, no injection resistance, no paresthesia and negative IV test  Additional Notes Patient identified. Risks/Benefits/Options discussed with patient including but not limited to bleeding, infection, nerve damage, paralysis, failed block, incomplete pain control, headache, blood pressure changes, nausea, vomiting, reactions to medication both or allergic, itching and postpartum back pain. Confirmed with bedside nurse the patient's most recent platelet count. Confirmed with patient that they are not currently taking any anticoagulation, have any bleeding history or any family history of bleeding disorders. Patient expressed understanding and wished to proceed. All questions were answered. Sterile technique was used throughout the entire procedure. Please see nursing notes for vital signs. Test dose was given through epidural catheter and negative prior to continuing to dose epidural or start infusion. Warning signs of high block given to the patient including shortness of breath, tingling/numbness in hands,  complete motor block, or any concerning symptoms with instructions to call for help. Patient was given instructions on fall risk and not to get out of bed. All questions and concerns addressed with instructions to call with any issues or inadequate analgesia.    CSE performed without complications. LOR at 7cm at L3/4. 25G pencan inserted into intrathecal space with return of CSF. Epidural catheter inserted into epidural space and left at 12cm at skin. Pt tolerated procedure well. VSS. Reason for block:procedure for pain

## 2022-03-07 NOTE — Lactation Note (Signed)
This note was copied from a baby's chart. Lactation Consultation Note  Patient Name: Kristin Richard [id number]'M Date: 03/07/2022 Reason for consult: Initial assessment;Term Age:39 hours P3, term female infant. Birth Parent latched infant on her left breast using the football hold position, infant latched with depth, swallows heard, infant was still breastfeeding after 10 minutes when LC left the room. Birth Parent will continue to BF infant according to hunger cues, on demand, 8 to 12+ times, STS. Birth Parent knows to call [place] if their are any BF questions, concerns or if latch assistance is needed. LC discussed infant's I&O, importance of maternal rest, diet and hydration. Mom made aware of O/P services, breastfeeding support groups, community resources, and our phone # for post-discharge questions.    Maternal Data Has patient been taught Hand Expression?: Yes Does the patient have breastfeeding experience prior to this delivery?: Yes How long did the patient breastfeed?: Per Birth Parent, she BF 1st child for 6 months and 2nd child who is one year old for 3 months.  Feeding Mother's Current Feeding Choice: Breast Milk  LATCH Score Latch: Grasps breast easily, tongue down, lips flanged, rhythmical sucking.  Audible Swallowing: Spontaneous and intermittent  Type of Nipple: Everted at rest and after stimulation  Comfort (Breast/Nipple): Soft / non-tender  Hold (Positioning): Assistance needed to correctly position infant at breast and maintain latch.  LATCH Score: 9   Lactation Tools Discussed/Used    Interventions Interventions: Breast feeding basics reviewed;Adjust position;Assisted with latch;Support pillows;Skin to skin;Position options;Hand express;Education;LC Services brochure;Breast compression  Discharge Pump: DEBP (Per Birth Parent, she has Medela DEBP at home.)  Consult Status Consult Status: Follow-up Date: 03/08/22 Follow-up type: In-patient    Eulis Canner 03/07/2022, 10:22 PM

## 2022-03-07 NOTE — Progress Notes (Signed)
Patient ID: Kristin Richard, female   DOB: April 01, 1985, 37 y.o.   MRN: [id number] Pt comfortable with epidural  Afeb VSS FHR category 1 Cervix c/8-9/-1 AROM clear  Placed in [place] position

## 2022-03-07 NOTE — Anesthesia Preprocedure Evaluation (Signed)
Anesthesia Evaluation  Patient identified by MRN, date of birth, ID band Patient awake    Reviewed: Allergy & Precautions, H&P , NPO status , Patient's Chart, lab work & pertinent test results  Airway Mallampati: II  TM Distance: >3 FB Neck ROM: Full    Dental no notable dental hx.    Pulmonary neg pulmonary ROS   Pulmonary exam normal breath sounds clear to auscultation       Cardiovascular negative cardio ROS Normal cardiovascular exam Rhythm:Regular Rate:Normal     Neuro/Psych negative neurological ROS  negative psych ROS   GI/Hepatic negative GI ROS, Neg liver ROS,,,  Endo/Other  negative endocrine ROS    Renal/GU negative Renal ROS  negative genitourinary   Musculoskeletal negative musculoskeletal ROS (+)    Abdominal   Peds negative pediatric ROS (+)  Hematology negative hematology ROS (+)   Anesthesia Other Findings Presents in labor  Reproductive/Obstetrics (+) Pregnancy                             Anesthesia Physical Anesthesia Plan  ASA: 2  Anesthesia Plan: Epidural   Post-op Pain Management:    Induction:   PONV Risk Score and Plan: Treatment may vary due to age or medical condition  Airway Management Planned: Natural Airway  Additional Equipment:   Intra-op Plan:   Post-operative Plan:   Informed Consent: I have reviewed the patients History and Physical, chart, labs and discussed the procedure including the risks, benefits and alternatives for the proposed anesthesia with the patient or authorized representative who has indicated his/her understanding and acceptance.       Plan Discussed with: Anesthesiologist  Anesthesia Plan Comments: (Patient identified. Risks, benefits, options discussed with patient including but not limited to bleeding, infection, nerve damage, paralysis, failed block, incomplete pain control, headache, blood pressure changes,  nausea, vomiting, reactions to medication, itching, and post partum back pain. Confirmed with bedside nurse the patient's most recent platelet count. Confirmed with the patient that they are not taking any anticoagulation, have any bleeding history or any family history of bleeding disorders. Patient expressed understanding and wishes to proceed. All questions were answered. )       Anesthesia Quick Evaluation

## 2022-03-07 NOTE — MAU Note (Signed)
.  Kristin Richard is a 37 y.o. at [redacted]w[redacted]d here in MAU reporting: ctx every 5 minutes since 1300 (7/10). Denies VB or LOF. Reports good FM.  LMP: N/A Onset of complaint: Today Pain score: 7/10  FHT:120 Lab orders placed from triage:  N/A

## 2022-03-08 LAB — CBC
HCT: 37.4 % (ref 36.0–46.0)
Hemoglobin: 12.8 g/dL (ref 12.0–15.0)
MCH: 30.7 pg (ref 26.0–34.0)
MCHC: 34.2 g/dL (ref 30.0–36.0)
MCV: 89.7 fL (ref 80.0–100.0)
Platelets: 200 10*3/uL (ref 150–400)
RBC: 4.17 MIL/uL (ref 3.87–5.11)
RDW: 13.2 % (ref 11.5–15.5)
WBC: 12.6 10*3/uL — ABNORMAL HIGH (ref 4.0–10.5)
nRBC: 0 % (ref 0.0–0.2)

## 2022-03-08 LAB — RPR: RPR Ser Ql: NONREACTIVE

## 2022-03-08 NOTE — Progress Notes (Signed)
Patient is eating, ambulating, voiding.  Pain control is good. Appropriate lochia, no complaints.  Vitals:   03/07/22 2131 03/07/22 2228 03/08/22 0245 03/08/22 0556  BP: 110/67 (!) 99/58 (!) 106/56 112/64  Pulse: 68 74 68 65  Resp: 18 18 18    Temp: 98.1 F (36.7 C) 98.1 F (36.7 C) 98 F (36.7 C) 97.7 F (36.5 C)  TempSrc: Oral Oral Oral Oral  SpO2: 99% 96% 97% 96%  Weight:      Height:        Fundus firm Ext: no calf tenderness  Lab Results  Component Value Date   WBC 12.6 (H) 03/08/2022   HGB 12.8 03/08/2022   HCT 37.4 03/08/2022   MCV 89.7 03/08/2022   PLT 200 03/08/2022    --/--/O POS (01/12 1608)  A/P Post partum day 1. Doing well Circ desired, reviewed risks/ complications, pt desires to proceed, baby not seen by peds yet and just about 12 hrs old so will plan to do tomorrow prior to discharge.  Routine care.  Expect d/c 1/14.    Allyn Kenner

## 2022-03-08 NOTE — Lactation Note (Signed)
This note was copied from a baby's chart. Lactation Consultation Note  Patient Name: Kristin Richard MEQAS'T Date: 03/08/2022  Reason for consult: Follow-up assessment Age:39 hours  P3, experienced breastfeeding mother who states breastfeeding is going well. She reports baby just fed for 20 minutes and having good output. Denies any concerns or needs from [facility]. Mother anticipates D/C tomorrow after infant's circumcision.  Instructed to call if needs assistance from LC/ RN with breastfeeding. Discussed OP LC services and support and mother states she has the resource sheet.    Feeding Mother's Current Feeding Choice: Breast Milk  Interventions Interventions: [place] [facility] brochure  Discharge Discharge Education: Engorgement and breast care;Warning signs for feeding baby  Consult Status Consult Status: Complete Date: 03/08/21    Kristin Richard 03/08/2022, 10:32 AM

## 2022-03-08 NOTE — Anesthesia Postprocedure Evaluation (Signed)
Anesthesia Post Note  Patient: Kristin Richard  Procedure(s) Performed: AN AD [place]     Patient location during evaluation: Mother Baby Anesthesia Type: Epidural Level of consciousness: awake Pain management: satisfactory to patient Vital Signs Assessment: post-procedure vital signs reviewed and stable Respiratory status: spontaneous breathing Cardiovascular status: stable Anesthetic complications: no   No notable events documented.  Last Vitals:  Vitals:   03/08/22 0556 03/08/22 0810  BP: 112/64 (!) 121/54  Pulse: 65 66  Resp:  18  Temp: 36.5 C 36.7 C  SpO2: 96%     Last Pain:  Vitals:   03/08/22 0810  TempSrc: Oral  PainSc: 0-No pain   Pain Goal:                   [employer]

## 2022-03-08 NOTE — Lactation Note (Addendum)
This note was copied from a baby's chart. Lactation Consultation Note  Patient Name: Boy Ravinder Hofland QBHAL'P Date: 03/08/2022 Reason for consult: Follow-up assessment;Mother's request;Difficult latch Age:39 hours Birth Parent requested LC assistance with latching infant on her right breast. LC discussed alignment chest to breast and waiting until infant's mouth is wide and chin and jaw downward, bring infant to breast.Birth Parent latched infant on her her right breast with depth and infant was still breastfeeding when Hardin left the room. LC encouraged Birth Parent to continue to work towards latching infant at breast and after few attempts if infant doesn't latch then call RN/ LC for further latch assistance.  Maternal Data Has patient been taught Hand Expression?: Yes Does the patient have breastfeeding experience prior to this delivery?: Yes How long did the patient breastfeed?: Per Birth Parent, she BF 1st child for 6 months and 2nd child who is [age] year old for 3 months.  Feeding Mother's Current Feeding Choice: Breast Milk  LATCH Score Latch: Grasps breast easily, tongue down, lips flanged, rhythmical sucking.  Audible Swallowing: Spontaneous and intermittent  Type of Nipple: Everted at rest and after stimulation  Comfort (Breast/Nipple): Soft / non-tender  Hold (Positioning): Assistance needed to correctly position infant at breast and maintain latch.  LATCH Score: 9   Lactation Tools Discussed/Used    Interventions Interventions: Adjust position;Breast compression;Skin to skin;Assisted with latch;Support pillows;Position options;Education  Discharge Pump: DEBP (Per Birth Parent, she has Medela DEBP at home.)  Consult Status Consult Status: Follow-up Date: 03/08/22 Follow-up type: In-patient    Eulis Canner 03/08/2022, 1:59 AM

## 2022-03-09 MED ORDER — OXYCODONE HCL 5 MG PO TABS: 5.0000 mg | ORAL_TABLET | ORAL | 0 refills | Status: DC | PRN

## 2022-03-09 MED ORDER — OXYCODONE HCL 5 MG PO TABS: 5.0000 mg | ORAL_TABLET | ORAL | Status: DC | PRN

## 2022-03-09 MED ORDER — OXYCODONE HCL 5 MG PO TABS: 5.0000 mg | ORAL_TABLET | ORAL | 0 refills | Status: AC | PRN

## 2022-03-09 NOTE — Discharge Summary (Signed)
Postpartum Discharge Summary    Patient Name: Kristin Richard DOB: 11-10-1985 MRN: [id number]  Date of admission: 03/07/2022 Delivery date:03/07/2022  Delivering provider: Paula Compton  Date of discharge: 03/09/2022  Admitting diagnosis: Indication for care in labor and delivery, antepartum [O75.9] NSVD (normal spontaneous vaginal delivery) [O80] Intrauterine pregnancy: [redacted]w[redacted]d     Secondary diagnosis:  Principal Problem:   Indication for care in labor and delivery, antepartum Active Problems:   NSVD (normal spontaneous vaginal delivery)  Additional problems: AMA, BMI >30    Discharge diagnosis: Term Pregnancy Delivered                                              Post partum procedures: none Augmentation: AROM Complications: None  Hospital course: Onset of Labor With Vaginal Delivery      37 y.o. yo G3P3003 at [redacted]w[redacted]d was admitted in Active Labor on 03/07/2022. Labor course was complicated bynothing  Membrane Rupture Time/Date: 6:21 PM ,03/07/2022   Delivery Method:Vaginal, Spontaneous  Episiotomy: None  Lacerations:  Periurethral  Patient had a postpartum course complicated by nothing.  She is ambulating, tolerating a regular diet, passing flatus, and urinating well. Patient is discharged home in stable condition on 03/09/22.  Newborn Data: Birth date:03/07/2022  Birth time:7:07 PM  Gender:Female  Living status:Living  Apgars:9 ,9  Weight:3657 g   Magnesium Sulfate received: No BMZ received: No Rhophylac:N/A Transfusion:No  Physical exam  Vitals:   03/08/22 0556 03/08/22 0810 03/08/22 1515 03/08/22 2111  BP: 112/64 (!) 121/54 125/68 105/65  Pulse: 65 66 69 69  Resp:  18 18 18   Temp: 97.7 F (36.5 C) 98.1 F (36.7 C) 98.4 F (36.9 C) 98.1 F (36.7 C)  TempSrc: Oral Oral Oral Oral  SpO2: 96%     Weight:      Height:       General: alert, cooperative, and no distress Lochia: appropriate Uterine Fundus: firm Incision: N/A DVT Evaluation: No evidence of DVT  seen on physical exam. Labs: Lab Results  Component Value Date   WBC 12.6 (H) 03/08/2022   HGB 12.8 03/08/2022   HCT 37.4 03/08/2022   MCV 89.7 03/08/2022   PLT 200 03/08/2022      Latest Ref Rng & Units 02/23/2022    2:14 PM  CMP  Glucose 70 - 99 mg/dL 94   BUN 6 - 20 mg/dL 10   Creatinine 0.44 - 1.00 mg/dL 0.68   Sodium 135 - 145 mmol/L 133   Potassium 3.5 - 5.1 mmol/L 4.1   Chloride 98 - 111 mmol/L 102   CO2 22 - 32 mmol/L 19   Calcium 8.9 - 10.3 mg/dL 8.9    Edinburgh Score:    03/07/2022    9:31 PM  Edinburgh Postnatal Depression Scale Screening Tool  I have been able to laugh and see the funny side of things. 0  I have looked forward with enjoyment to things. 0  I have blamed myself unnecessarily when things went wrong. 1  I have been anxious or worried for no good reason. 1  I have felt scared or panicky for no good reason. 0  Things have been getting on top of me. 1  I have been so unhappy that I have had difficulty sleeping. 0  I have felt sad or miserable. 0  I have been so unhappy that I have  been crying. 0  The thought of harming myself has occurred to me. 0  Edinburgh Postnatal Depression Scale Total 3      After visit meds:  Allergies as of 03/09/2022   No Known Allergies      Medication List     STOP taking these medications    albuterol 108 (90 Base) MCG/ACT inhaler Commonly known as: VENTOLIN HFA   prenatal multivitamin Tabs tablet       TAKE these medications    oxyCODONE 5 MG immediate release tablet Commonly known as: Oxy IR/ROXICODONE Take 1 tablet (5 mg total) by mouth every 4 (four) hours as needed for severe pain.         Discharge home in stable condition Infant Disposition:home with mother Discharge instruction: per After Visit Summary and Postpartum booklet. Activity: Advance as tolerated. Pelvic rest for 6 weeks.  Diet: routine diet Anticipated Birth Control: Unsure Postpartum Appointment:4 weeks Additional  Postpartum F/U: Postpartum Depression checkup Future Appointments:No future appointments. Follow up Visit:  [place]. Call in 4 week(s).   Contact information: [address]  [address] [place] [address] [phone]                     03/09/2022 Allyn Kenner, DO

## 2022-03-10 ENCOUNTER — Inpatient Hospital Stay (HOSPITAL_COMMUNITY): Payer: 59

## 2022-03-10 ENCOUNTER — Inpatient Hospital Stay (HOSPITAL_COMMUNITY): Admission: RE | Admit: 2022-03-10 | Payer: 59 | Source: Home / Self Care | Admitting: Obstetrics and Gynecology

## 2022-03-15 ENCOUNTER — Telehealth (HOSPITAL_COMMUNITY): Payer: Self-pay

## 2022-03-15 NOTE — Telephone Encounter (Signed)
Patient did not answer phone call. Voicemail left for patient.   Sharyn Lull [facility] 03/15/22,0951
